# Patient Record
Sex: Female | Born: 1995 | Race: White | Hispanic: Yes | Marital: Single | Smoking: Never smoker
Health system: Southern US, Community
[De-identification: ages and names within clinical notes are randomized; demographics above are authoritative.]

## PROBLEM LIST (undated history)

## (undated) DIAGNOSIS — J45909 Unspecified asthma, uncomplicated: Secondary | ICD-10-CM

## (undated) HISTORY — PX: NO PAST SURGERIES: SHX2092

---
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
## 2018-11-04 ENCOUNTER — Ambulatory Visit
Admission: EM | Admit: 2018-11-04 | Discharge: 2018-11-04 | Disposition: A | Discharge status: Home / Self Care | Payer: Self-pay | Attending: Emergency Medicine | Admitting: Emergency Medicine

## 2018-11-04 ENCOUNTER — Encounter: Payer: Self-pay | Admitting: Emergency Medicine

## 2018-11-04 ENCOUNTER — Other Ambulatory Visit: Payer: Self-pay

## 2018-11-04 DIAGNOSIS — R519 Headache, unspecified: Secondary | ICD-10-CM

## 2018-11-04 DIAGNOSIS — H1131 Conjunctival hemorrhage, right eye: Secondary | ICD-10-CM

## 2018-11-04 DIAGNOSIS — Z3202 Encounter for pregnancy test, result negative: Secondary | ICD-10-CM

## 2018-11-04 DIAGNOSIS — R51 Headache: Secondary | ICD-10-CM

## 2018-11-04 HISTORY — DX: Unspecified asthma, uncomplicated: J45.909

## 2018-11-04 LAB — PREGNANCY, URINE: Preg Test, Ur: NEGATIVE

## 2018-11-04 NOTE — ED Provider Notes (Signed)
HPI  SUBJECTIVE:  Kim Marshall is a 23 y.o. female who reports  gradual onset throbbing diffuse HA starting this morning.  She states that she "felt high" this morning.  This lasted for 2 hours and resolved with 800 mg of ibuprofen which she took at 1130.  Symptoms are relieved with ibuprofen.  No aggravating factors.  No fevers, N/V, photophobia, phonophobia, visual changes, purulent nasal d/c, nasal congestion, sinus pain/pressure, ear pain, jaw pain, dental pain,  dysarthria, focal weakness, facial droop, discoordination. No body aches, neck stiffness, rash. No seizures, syncope. Pt is not sure if she could be pregnant. No sudden onset, did not occur during exertion.  She also reports a right-sided subconjunctival hemorrhage.  She states that she was crying this morning and rubbing her eyes vigorously.  She reports that she lost a favorite uncle earlier this week.  She states that she slept well last night.  Denies taking any medications last night.  Past medical history of migraines,asthma.  negative for aneurysm, SAH/ICH, HIV, sinusitis, glaucoma, stroke, atrial fibrillation or temporal arteritis. Pt is not on any antiplatelets/anticoagulants. FH positive for stroke.  Father had a stroke in his 30s.  LMP: 2 months ago.  Does not use any birth control and is sexually active.  [facility]   Past Medical History:  Diagnosis Date  . Asthma     Past Surgical History:  Procedure Laterality Date  . NO PAST SURGERIES      Family History  Problem Relation Age of Onset  . Hyperlipidemia Mother   . Diabetes Mother   . Diabetes Father     Social History   Tobacco Use  . Smoking status: Never Smoker  . Smokeless tobacco: Never Used  Substance Use Topics  . Alcohol use: Yes    Alcohol/week: 2.0 standard drinks    Types: 2 Standard drinks or equivalent per week    Comment: socially  . Drug use: Yes    Frequency: 3.0 times per week    Types: Marijuana    No current  facility-administered medications for this encounter.  No current outpatient medications on file.  No Known Allergies   ROS  As noted in HPI.   Physical Exam  BP (!) 129/91 (BP Location: Left Arm)   Pulse 65   Temp 98.4 F (36.9 C) (Oral)   Resp 18   Ht 5\' 4"  (1.626 m)   Wt 77.1 kg   LMP 09/09/2018 (Approximate)   SpO2 100%   BMI 29.18 kg/m   Constitutional: Well developed, well nourished, no acute distress Eyes: PERRL, EOMI, positive large subconjunctival hemorrhage right eye.  No photophobia.  No corneal abrasion seen on fluorescein exam. Left eye: 20/40 Right eye: 20/20 Bilateral: 20/20     HENT: Normocephalic, atraumatic,mucus membranes moist, normal nontender dentition.  TM obscured with cerumen bilaterally. no TMJ tenderness.No nasal congestion, no sinus tenderness. No temporal artery tenderness.  Neck: no cervical LN + trapezial muscle tenderness. No meningismus Respiratory: normal inspiratory effort Cardiovascular: Normal rate, regular rhythm no murmurs rubs or gallops GI:  nondistended skin: No rash, skin intact Musculoskeletal: No edema, no tenderness, no deformities Neurologic: Alert & oriented x 3, CN III-XII intact, romberg neg, finger-> nose, heel-> shin equal b/l, Romberg neg, tandem gait steady Psychiatric: Speech and behavior appropriate   ED Course  Medications - No data to display  Orders Placed This Encounter  Procedures  . Pregnancy, urine    Standing Status:   Standing  Number of Occurrences:   1   Results for orders placed or performed during the hospital encounter of 11/04/18 (from the past 24 hour(s))  Pregnancy, urine     Status: None   Collection Time: 11/04/18  2:40 PM  Result Value Ref Range   Preg Test, Ur NEGATIVE NEGATIVE   No results found.   ED Clinical Impression  1. Subconjunctival hemorrhage of right eye   2. Acute nonintractable headache, unspecified headache type     ED Assessment/Plan  Urine pregnancy  negative.  She has a subconjunctival hemorrhage.  Advised patient that this will resolve on its own.  Suspect that this is from rubbing her eyes.  No corneal abrasion.  Headache does not appear to be an emergency.  Most likely tension headache.  Pt describing typical pain, no sudden onset. Doubt SAH, ICH or space occupying lesion. Pt without fevers/chills, Pt has no meningeal sx, no nuchal rigidity. Doubt meningitis. Pt with normal neuro exam, no evidence of CVA/TIA.  Pt BP not elevated significantly, doubt hypertensive emergency. No evidence of temporal artery tenderness, no evidence of glaucoma or other ocular pathology.  She is completely neurologically intact.  Doubt stroke.  Pt meets [facility] criteria (age < 64, no neck pain or stiffness, no onset during exertion, no syncope at onset, no maximum severity within 1 second and no resistance to neck flexion on exam).   Patient declined prescription of ibuprofen, states that she has a prescription already at home.  Advised her to take this 3-4 times a day with 1 g of Tylenol.  She is to go immediately to the ER if her headache gets worse, otherwise follow-up with her primary care physician at [facility] as needed.   Discussed  MDM, plan for follow up, signs and sx that should prompt return to ER. Pt agrees with plan  No orders of the defined types were placed in this encounter.   *This clinic note was created using Dragon dictation software. Therefore, there may be occasional mistakes despite careful proofreading.  ?   Melynda Ripple, MD 11/04/18 [phone]

## 2018-11-04 NOTE — ED Triage Notes (Signed)
Patient in today c/o broken blood vessel in her right eye and headache since this morning.

## 2018-11-04 NOTE — Discharge Instructions (Addendum)
Take 800 mg ibuprofen combined with 1 g of Tylenol 3 times a day.  Make sure you drink at least 2 L of non-caffeinated, non-sugary beverages a day.

## 2018-11-27 ENCOUNTER — Ambulatory Visit
Admission: EM | Admit: 2018-11-27 | Discharge: 2018-11-27 | Disposition: A | Discharge status: Home / Self Care | Payer: Medicaid Other | Attending: Family Medicine | Admitting: Family Medicine

## 2018-11-27 ENCOUNTER — Encounter: Payer: Self-pay | Admitting: Emergency Medicine

## 2018-11-27 ENCOUNTER — Other Ambulatory Visit: Payer: Self-pay

## 2018-11-27 DIAGNOSIS — R079 Chest pain, unspecified: Secondary | ICD-10-CM | POA: Diagnosis present

## 2018-11-27 DIAGNOSIS — M549 Dorsalgia, unspecified: Secondary | ICD-10-CM | POA: Diagnosis not present

## 2018-11-27 DIAGNOSIS — S39012A Strain of muscle, fascia and tendon of lower back, initial encounter: Secondary | ICD-10-CM | POA: Insufficient documentation

## 2018-11-27 DIAGNOSIS — K219 Gastro-esophageal reflux disease without esophagitis: Secondary | ICD-10-CM

## 2018-11-27 DIAGNOSIS — R067 Sneezing: Secondary | ICD-10-CM | POA: Insufficient documentation

## 2018-11-27 DIAGNOSIS — R0989 Other specified symptoms and signs involving the circulatory and respiratory systems: Secondary | ICD-10-CM | POA: Insufficient documentation

## 2018-11-27 DIAGNOSIS — J4520 Mild intermittent asthma, uncomplicated: Secondary | ICD-10-CM

## 2018-11-27 DIAGNOSIS — Z20828 Contact with and (suspected) exposure to other viral communicable diseases: Secondary | ICD-10-CM | POA: Insufficient documentation

## 2018-11-27 MED ORDER — PREDNISONE 20 MG PO TABS: ORAL_TABLET | ORAL | 0 refills | Status: DC

## 2018-11-27 MED ORDER — CYCLOBENZAPRINE HCL 10 MG PO TABS: 10.0000 mg | ORAL_TABLET | Freq: Every day | ORAL | 0 refills | Status: DC

## 2018-11-27 NOTE — ED Triage Notes (Signed)
Pt c/o chest pain in the center of her chest. Worse when she takes a deep breath and the pain is reproduced when she presses on the area. She also c/o  Shortness of breath. She was tested on 11/24/18 for COVID and negative but pt says she was having sneezing, cough and shortness of breath at that time. She did not have the pain in her chest when she was tested. Pt has h/o asthma.

## 2018-11-27 NOTE — Discharge Instructions (Addendum)
Heat to back area Over the counter Pepcid daily Tylenol as needed

## 2018-11-28 LAB — NOVEL CORONAVIRUS, NAA (HOSP ORDER, SEND-OUT TO REF LAB; TAT 18-24 HRS): SARS-CoV-2, NAA: NOT DETECTED

## 2018-11-29 NOTE — ED Provider Notes (Signed)
[facility]    CSN: [id number] Arrival date & time: 11/27/18  1348      History   Chief Complaint Chief Complaint  Patient presents with  . Chest Pain  . Shortness of Breath    HPI Kim Marshall is a 23 y.o. female.   23 yo female with a c/o intermittent shortness of breath and "sharp" chest pains if she takes a deep breath. States she's had cold symptoms with runny nose, sneezing, congestion. Denies any fevers, chills. Complains of back pain and acid reflux as well. Denies any injuries.    Chest Pain Associated symptoms: shortness of breath   Shortness of Breath Associated symptoms: chest pain     Past Medical History:  Diagnosis Date  . Asthma     There are no active problems to display for this patient.   Past Surgical History:  Procedure Laterality Date  . NO PAST SURGERIES      OB History   No obstetric history on file.      Home Medications    Prior to Admission medications   Medication Sig Start Date End Date Taking? Authorizing Provider  cyclobenzaprine (FLEXERIL) 10 MG tablet Take 1 tablet (10 mg total) by mouth at bedtime. 11/27/18   Payton Mccallum, MD  predniSONE (DELTASONE) 20 MG tablet 2 tabs po qd 11/27/18   Payton Mccallum, MD    Family History Family History  Problem Relation Age of Onset  . Hyperlipidemia Mother   . Diabetes Mother   . Diabetes Father     Social History Social History   Tobacco Use  . Smoking status: Never Smoker  . Smokeless tobacco: Never Used  Substance Use Topics  . Alcohol use: Yes    Alcohol/week: 2.0 standard drinks    Types: 2 Standard drinks or equivalent per week    Comment: socially  . Drug use: Yes    Frequency: 3.0 times per week    Types: Marijuana     Allergies   Patient has no known allergies.   Review of Systems Review of Systems  Respiratory: Positive for shortness of breath.   Cardiovascular: Positive for chest pain.     Physical Exam Triage Vital Signs ED Triage  Vitals  Enc Vitals Group     BP 11/27/18 1408 114/70     Pulse Rate 11/27/18 1408 60     Resp 11/27/18 1408 18     Temp 11/27/18 1408 98.4 F (36.9 C)     Temp Source 11/27/18 1408 Oral     SpO2 11/27/18 1408 99 %     Weight 11/27/18 1404 170 lb (77.1 kg)     Height 11/27/18 1404 5\' 4"  (1.626 m)     Head Circumference --      Peak Flow --      Pain Score 11/27/18 1403 5     Pain Loc --      Pain Edu? --      Excl. in GC? --    No data found.  Updated Vital Signs BP 114/70 (BP Location: Left Arm)   Pulse 60   Temp 98.4 F (36.9 C) (Oral)   Resp 18   Ht 5\' 4"  (1.626 m)   Wt 77.1 kg   SpO2 99%   BMI 29.18 kg/m   Visual Acuity Right Eye Distance:   Left Eye Distance:   Bilateral Distance:    Right Eye Near:   Left Eye Near:    Bilateral Near:  Physical Exam Vitals signs and nursing note reviewed.  Constitutional:      General: She is not in acute distress.    Appearance: She is not toxic-appearing or diaphoretic.  Cardiovascular:     Rate and Rhythm: Normal rate.     Heart sounds: Normal heart sounds.  Pulmonary:     Effort: Pulmonary effort is normal. No respiratory distress.     Breath sounds: Normal breath sounds. No stridor. No wheezing or rhonchi.  Abdominal:     General: Bowel sounds are normal. There is no distension.     Palpations: Abdomen is soft.     Tenderness: There is no abdominal tenderness.  Neurological:     Mental Status: She is alert.      UC Treatments / Results  Labs (all labs ordered are listed, but only abnormal results are displayed) Labs Reviewed  NOVEL CORONAVIRUS, NAA (HOSP ORDER, SEND-OUT TO REF LAB; TAT 18-24 HRS)    EKG   Radiology No results found.  Procedures Procedures (including critical care time)  Medications Ordered in UC Medications - No data to display  Initial Impression / Assessment and Plan / UC Course  I have reviewed the triage vital signs and the nursing notes.  Pertinent labs & imaging  results that were available during my care of the patient were reviewed by me and considered in my medical decision making (see chart for details).      Final Clinical Impressions(s) / UC Diagnoses   Final diagnoses:  Mild intermittent asthma without complication  Gastroesophageal reflux disease, esophagitis presence not specified  Back strain, initial encounter     Discharge Instructions     Heat to back area Over the counter Pepcid daily Tylenol as needed    ED Prescriptions    Medication Sig Dispense Auth. Provider   predniSONE (DELTASONE) 20 MG tablet 2 tabs po qd 10 tablet Norval Gable, MD   cyclobenzaprine (FLEXERIL) 10 MG tablet Take 1 tablet (10 mg total) by mouth at bedtime. 30 tablet Norval Gable, MD      1. Labs/x-ray results and diagnosis reviewed with patient 2. rx as per orders above; reviewed possible side effects, interactions, risks and benefits  3. Recommend supportive treatment as above 4. covid test done 5. Follow-up prn if symptoms worsen or don't improve  PDMP not reviewed this encounter.   Norval Gable, MD 11/29/18 [phone]

## 2019-04-01 ENCOUNTER — Ambulatory Visit: Payer: Medicaid Other | Admitting: Family Medicine

## 2019-05-10 ENCOUNTER — Ambulatory Visit: Payer: Medicaid Other | Attending: Internal Medicine

## 2019-05-10 DIAGNOSIS — Z20822 Contact with and (suspected) exposure to covid-19: Secondary | ICD-10-CM

## 2019-05-11 LAB — NOVEL CORONAVIRUS, NAA: SARS-CoV-2, NAA: NOT DETECTED

## 2019-05-17 ENCOUNTER — Ambulatory Visit: Payer: Medicaid Other | Attending: Internal Medicine

## 2019-05-17 DIAGNOSIS — Z20822 Contact with and (suspected) exposure to covid-19: Secondary | ICD-10-CM

## 2019-05-18 LAB — NOVEL CORONAVIRUS, NAA: SARS-CoV-2, NAA: NOT DETECTED

## 2019-05-26 ENCOUNTER — Ambulatory Visit: Payer: Medicaid Other | Attending: Internal Medicine

## 2019-05-26 DIAGNOSIS — Z20822 Contact with and (suspected) exposure to covid-19: Secondary | ICD-10-CM

## 2019-05-27 LAB — SARS-COV-2, NAA 2 DAY TAT

## 2019-05-27 LAB — NOVEL CORONAVIRUS, NAA: SARS-CoV-2, NAA: NOT DETECTED

## 2019-05-31 ENCOUNTER — Ambulatory Visit: Payer: Medicaid Other | Attending: Internal Medicine

## 2019-05-31 DIAGNOSIS — Z20822 Contact with and (suspected) exposure to covid-19: Secondary | ICD-10-CM

## 2019-06-01 LAB — NOVEL CORONAVIRUS, NAA: SARS-CoV-2, NAA: NOT DETECTED

## 2019-06-01 LAB — SARS-COV-2, NAA 2 DAY TAT

## 2019-06-07 ENCOUNTER — Ambulatory Visit: Payer: Medicaid Other | Attending: Internal Medicine

## 2019-06-07 DIAGNOSIS — Z20822 Contact with and (suspected) exposure to covid-19: Secondary | ICD-10-CM

## 2019-06-09 LAB — NOVEL CORONAVIRUS, NAA: SARS-CoV-2, NAA: NOT DETECTED

## 2019-06-09 LAB — SARS-COV-2, NAA 2 DAY TAT

## 2019-06-14 ENCOUNTER — Ambulatory Visit: Payer: Medicaid Other | Attending: Internal Medicine

## 2019-06-14 DIAGNOSIS — Z20822 Contact with and (suspected) exposure to covid-19: Secondary | ICD-10-CM

## 2019-06-16 LAB — SARS-COV-2, NAA 2 DAY TAT

## 2019-06-16 LAB — NOVEL CORONAVIRUS, NAA: SARS-CoV-2, NAA: NOT DETECTED

## 2019-06-21 ENCOUNTER — Ambulatory Visit: Payer: Medicaid Other | Attending: Internal Medicine

## 2019-06-21 DIAGNOSIS — Z20822 Contact with and (suspected) exposure to covid-19: Secondary | ICD-10-CM

## 2019-06-22 LAB — SARS-COV-2, NAA 2 DAY TAT

## 2019-06-22 LAB — NOVEL CORONAVIRUS, NAA: SARS-CoV-2, NAA: NOT DETECTED

## 2019-06-28 ENCOUNTER — Ambulatory Visit: Payer: Medicaid Other | Attending: Internal Medicine

## 2019-06-28 DIAGNOSIS — Z20822 Contact with and (suspected) exposure to covid-19: Secondary | ICD-10-CM

## 2019-06-29 LAB — SARS-COV-2, NAA 2 DAY TAT

## 2019-06-29 LAB — NOVEL CORONAVIRUS, NAA: SARS-CoV-2, NAA: NOT DETECTED

## 2019-07-05 ENCOUNTER — Ambulatory Visit: Payer: Medicaid Other | Attending: Internal Medicine

## 2019-07-05 DIAGNOSIS — Z20822 Contact with and (suspected) exposure to covid-19: Secondary | ICD-10-CM

## 2019-07-06 LAB — SARS-COV-2, NAA 2 DAY TAT

## 2019-07-06 LAB — NOVEL CORONAVIRUS, NAA: SARS-CoV-2, NAA: NOT DETECTED

## 2020-03-27 ENCOUNTER — Other Ambulatory Visit: Payer: Medicaid Other

## 2020-10-22 ENCOUNTER — Other Ambulatory Visit: Payer: Self-pay

## 2020-10-22 ENCOUNTER — Emergency Department: Payer: Self-pay

## 2020-10-22 ENCOUNTER — Emergency Department
Admission: EM | Admit: 2020-10-22 | Discharge: 2020-10-22 | Disposition: A | Discharge status: Home / Self Care | Payer: Self-pay | Attending: Emergency Medicine | Admitting: Emergency Medicine

## 2020-10-22 ENCOUNTER — Encounter: Payer: Self-pay | Admitting: Emergency Medicine

## 2020-10-22 DIAGNOSIS — S161XXA Strain of muscle, fascia and tendon at neck level, initial encounter: Secondary | ICD-10-CM | POA: Diagnosis not present

## 2020-10-22 DIAGNOSIS — Y92410 Unspecified street and highway as the place of occurrence of the external cause: Secondary | ICD-10-CM | POA: Diagnosis not present

## 2020-10-22 DIAGNOSIS — J45909 Unspecified asthma, uncomplicated: Secondary | ICD-10-CM | POA: Diagnosis not present

## 2020-10-22 DIAGNOSIS — S199XXA Unspecified injury of neck, initial encounter: Secondary | ICD-10-CM | POA: Diagnosis present

## 2020-10-22 MED ORDER — IBUPROFEN 600 MG PO TABS
600.0000 mg | ORAL_TABLET | Freq: Once | ORAL | Status: AC
  Administered 2020-10-22: 600 mg via ORAL
  Filled 2020-10-22: qty 1

## 2020-10-22 MED ORDER — NAPROXEN 500 MG PO TABS: 500.0000 mg | ORAL_TABLET | Freq: Two times a day (BID) | ORAL | 0 refills | Status: AC

## 2020-10-22 MED ORDER — CYCLOBENZAPRINE HCL 10 MG PO TABS
5.0000 mg | ORAL_TABLET | Freq: Once | ORAL | Status: AC
  Administered 2020-10-22: 5 mg via ORAL
  Filled 2020-10-22: qty 1

## 2020-10-22 MED ORDER — CYCLOBENZAPRINE HCL 5 MG PO TABS
5.0000 mg | ORAL_TABLET | Freq: Three times a day (TID) | ORAL | 0 refills | Status: AC | PRN
Start: 2020-10-22 — End: ?

## 2020-10-22 NOTE — Discharge Instructions (Signed)
Take the prescription meds as directed. Follow-up with [facility] or return if needed.
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
## 2021-01-31 ENCOUNTER — Other Ambulatory Visit: Payer: Self-pay

## 2021-01-31 ENCOUNTER — Emergency Department
Admission: EM | Admit: 2021-01-31 | Discharge: 2021-01-31 | Disposition: A | Discharge status: Home / Self Care | Payer: Medicaid Other | Attending: Emergency Medicine | Admitting: Emergency Medicine

## 2021-01-31 ENCOUNTER — Encounter: Payer: Self-pay | Admitting: Emergency Medicine

## 2021-01-31 ENCOUNTER — Emergency Department: Payer: Medicaid Other

## 2021-01-31 DIAGNOSIS — J45909 Unspecified asthma, uncomplicated: Secondary | ICD-10-CM | POA: Insufficient documentation

## 2021-01-31 DIAGNOSIS — J101 Influenza due to other identified influenza virus with other respiratory manifestations: Secondary | ICD-10-CM | POA: Insufficient documentation

## 2021-01-31 DIAGNOSIS — J111 Influenza due to unidentified influenza virus with other respiratory manifestations: Secondary | ICD-10-CM

## 2021-01-31 DIAGNOSIS — Z20822 Contact with and (suspected) exposure to covid-19: Secondary | ICD-10-CM | POA: Insufficient documentation

## 2021-01-31 DIAGNOSIS — R300 Dysuria: Secondary | ICD-10-CM | POA: Insufficient documentation

## 2021-01-31 LAB — BASIC METABOLIC PANEL
Anion gap: 7 (ref 5–15)
BUN: 10 mg/dL (ref 6–20)
CO2: 24 mmol/L (ref 22–32)
Calcium: 9 mg/dL (ref 8.9–10.3)
Chloride: 102 mmol/L (ref 98–111)
Creatinine, Ser: 0.67 mg/dL (ref 0.44–1.00)
GFR, Estimated: >60 mL/min (ref >60)
Glucose, Bld: 113 mg/dL — ABNORMAL HIGH (ref 70–99)
Potassium: 3.3 mmol/L — ABNORMAL LOW (ref 3.5–5.1)
Sodium: 133 mmol/L — ABNORMAL LOW (ref 135–145)

## 2021-01-31 LAB — CBC
HCT: 43.4 % (ref 36.0–46.0)
Hemoglobin: 15.1 g/dL — ABNORMAL HIGH (ref 12.0–15.0)
MCH: 29.2 pg (ref 26.0–34.0)
MCHC: 34.8 g/dL (ref 30.0–36.0)
MCV: 83.9 fL (ref 80.0–100.0)
Platelets: 244 10*3/uL (ref 150–400)
RBC: 5.17 MIL/uL — ABNORMAL HIGH (ref 3.87–5.11)
RDW: 12.9 % (ref 11.5–15.5)
WBC: 13 10*3/uL — ABNORMAL HIGH (ref 4.0–10.5)
nRBC: 0 % (ref 0.0–0.2)

## 2021-01-31 LAB — URINALYSIS, COMPLETE (UACMP) WITH MICROSCOPIC
Bilirubin Urine: NEGATIVE
Glucose, UA: NEGATIVE mg/dL
Leukocytes,Ua: NEGATIVE
Nitrite: NEGATIVE
Protein, ur: 100 mg/dL — AB
Specific Gravity, Urine: >1.03 — ABNORMAL HIGH (ref 1.005–1.030)
pH: 6 (ref 5.0–8.0)

## 2021-01-31 LAB — TROPONIN I (HIGH SENSITIVITY): Troponin I (High Sensitivity): 5 ng/L (ref <18)

## 2021-01-31 LAB — RESP PANEL BY RT-PCR (FLU A&B, COVID) ARPGX2
Influenza A by PCR: POSITIVE — AB
Influenza B by PCR: NEGATIVE
SARS Coronavirus 2 by RT PCR: NEGATIVE

## 2021-01-31 LAB — GROUP A STREP BY PCR: Group A Strep by PCR: NOT DETECTED

## 2021-01-31 LAB — POC URINE PREG, ED: Preg Test, Ur: NEGATIVE

## 2021-01-31 MED ORDER — ONDANSETRON HCL 4 MG PO TABS: 4.0000 mg | ORAL_TABLET | Freq: Every day | ORAL | 1 refills | Status: AC | PRN

## 2021-01-31 MED ORDER — IBUPROFEN 400 MG PO TABS
400.0000 mg | ORAL_TABLET | Freq: Once | ORAL | Status: AC
  Administered 2021-01-31: 400 mg via ORAL
  Filled 2021-01-31: qty 1

## 2021-01-31 MED ORDER — ONDANSETRON HCL 4 MG PO TABS
4.0000 mg | ORAL_TABLET | Freq: Once | ORAL | Status: AC
  Administered 2021-01-31: 4 mg via ORAL
  Filled 2021-01-31: qty 1

## 2021-01-31 MED ORDER — ACETAMINOPHEN 325 MG PO TABS
650.0000 mg | ORAL_TABLET | Freq: Once | ORAL | Status: AC
  Administered 2021-01-31: 650 mg via ORAL
  Filled 2021-01-31: qty 2

## 2021-01-31 MED ORDER — ALBUTEROL SULFATE HFA 108 (90 BASE) MCG/ACT IN AERS: 2.0000 | INHALATION_SPRAY | Freq: Four times a day (QID) | RESPIRATORY_TRACT | 2 refills | Status: AC | PRN

## 2021-01-31 MED ORDER — SODIUM CHLORIDE 0.9 % IV BOLUS
1000.0000 mL | Freq: Once | INTRAVENOUS | Status: AC
  Administered 2021-01-31: 1000 mL via INTRAVENOUS

## 2021-01-31 NOTE — ED Provider Notes (Signed)
[facility]  ____________________________________________   Event Date/Time   First MD Initiated Contact with Patient 01/31/21 2047     (approximate)  I have reviewed the triage vital signs and the nursing notes.   HISTORY  Chief Complaint Chest Pain, Shortness of Breath, and Nausea    HPI Kim Marshall is a 25 y.o. female with past medical history of asthma who presents with fever cough myalgias.  Symptoms started about 2 days ago.  She endorses cough that is productive of clear sputum as well as some central chest pain that is worse with coughing.  She has had several episodes of emesis and diarrhea but denies abdominal pain.  Has had subjective fevers as well.  Her partner was sick last week for this is resolved.  She also endorses myalgias and some dysuria.  No back pain.         Past Medical History:  Diagnosis Date   Asthma     There are no problems to display for this patient.   Past Surgical History:  Procedure Laterality Date   NO PAST SURGERIES      Prior to Admission medications   Medication Sig Start Date End Date Taking? Authorizing Provider  albuterol (VENTOLIN HFA) 108 (90 Base) MCG/ACT inhaler Inhale 2 puffs into the lungs every 6 (six) hours as needed for wheezing or shortness of breath. 01/31/21  Yes Georga Hacking, MD  ondansetron (ZOFRAN) 4 MG tablet Take 1 tablet (4 mg total) by mouth daily as needed for nausea or vomiting. 01/31/21 01/31/22 Yes Georga Hacking, MD  cyclobenzaprine (FLEXERIL) 5 MG tablet Take 1 tablet (5 mg total) by mouth 3 (three) times daily as needed. 10/22/20   Menshew, Charlesetta Ivory, PA-C    Allergies Patient has no known allergies.  Family History  Problem Relation Age of Onset   Hyperlipidemia Mother    Diabetes Mother    Diabetes Father     Social History Social History   Tobacco Use   Smoking status: Never   Smokeless tobacco: Never  Vaping Use   Vaping Use: Never used   Substance Use Topics   Alcohol use: Yes    Alcohol/week: 2.0 standard drinks    Types: 2 Standard drinks or equivalent per week    Comment: socially   Drug use: Yes    Frequency: 3.0 times per week    Types: Marijuana    Review of Systems   Review of Systems  Constitutional:  Positive for chills and fever.  HENT:  Positive for congestion.   Respiratory:  Positive for cough, chest tightness and shortness of breath.   Gastrointestinal:  Positive for diarrhea, nausea and vomiting. Negative for abdominal pain.  Genitourinary:  Positive for dysuria.  Musculoskeletal:  Positive for myalgias.  All other systems reviewed and are negative.  Physical Exam Updated Vital Signs BP (!) 144/87   Pulse (!) 101   Temp (!) 100.8 F (38.2 C) (Oral)   Resp 20   Ht 5\' 3"  (1.6 m)   Wt 83.9 kg   SpO2 93%   BMI 32.77 kg/m   Physical Exam Vitals and nursing note reviewed.  Constitutional:      General: She is not in acute distress.    Appearance: Normal appearance.  HENT:     Head: Normocephalic and atraumatic.  Eyes:     General: No scleral icterus.    Conjunctiva/sclera: Conjunctivae normal.  Cardiovascular:     Rate and Rhythm: Regular  rhythm. Tachycardia present.     Heart sounds: Normal heart sounds.  Pulmonary:     Effort: Pulmonary effort is normal. No respiratory distress.     Breath sounds: No stridor. No wheezing.  Abdominal:     Palpations: Abdomen is soft.     Tenderness: There is no abdominal tenderness. There is no guarding.  Musculoskeletal:        General: No deformity or signs of injury.     Cervical back: Normal range of motion.  Skin:    General: Skin is dry.     Coloration: Skin is not jaundiced or pale.  Neurological:     General: No focal deficit present.     Mental Status: She is alert and oriented to person, place, and time. Mental status is at baseline.  Psychiatric:        Mood and Affect: Mood normal.        Behavior: Behavior normal.      LABS (all labs ordered are listed, but only abnormal results are displayed)  Labs Reviewed  RESP PANEL BY RT-PCR (FLU A&B, COVID) ARPGX2 - Abnormal; Notable for the following components:      Result Value   Influenza A by PCR POSITIVE (*)    All other components within normal limits  BASIC METABOLIC PANEL - Abnormal; Notable for the following components:   Sodium 133 (*)    Potassium 3.3 (*)    Glucose, Bld 113 (*)    All other components within normal limits  CBC - Abnormal; Notable for the following components:   WBC 13.0 (*)    RBC 5.17 (*)    Hemoglobin 15.1 (*)    All other components within normal limits  URINALYSIS, COMPLETE (UACMP) WITH MICROSCOPIC - Abnormal; Notable for the following components:   Specific Gravity, Urine >1.030 (*)    Hgb urine dipstick TRACE (*)    Ketones, ur TRACE (*)    Protein, ur 100 (*)    Bacteria, UA RARE (*)    All other components within normal limits  GROUP A STREP BY PCR  POC URINE PREG, ED  TROPONIN I (HIGH SENSITIVITY)   ____________________________________________  EKG  Sinus tachycardia, normal axis, normal intervals, nonspecific T wave flattening throughout ____________________________________________  RADIOLOGY I, Randol Kern, personally viewed and evaluated these images (plain radiographs) as part of my medical decision making, as well as reviewing the written report by the radiologist.  ED MD interpretation:  I reviewed the CXR which does not show any acute cardiopulmonary process      ____________________________________________   PROCEDURES  Procedure(s) performed (including Critical Care):  Procedures   ____________________________________________   INITIAL IMPRESSION / ASSESSMENT AND PLAN / ED COURSE  Patient is a female presenting with cough fevers myalgias and shortness of breath x2 days.  She is tachycardic and febrile.  Overall she looks well on exam she is not wheezing abdomen is benign.   She is still somewhat tachycardic labs are notable for leukocytosis of 13, BMP reassuring.  A troponin was obtained given her chest pain which is negative, her EKG has some nonspecific T wave abnormalities but her pain is nonischemic, worse with coughing.  She is notably flu a positive.  She was given Tylenol and Motrin and continued to be tachycardic despite her fever improving somewhat.  We will place an IV and give her a liter of fluids and reassess.  She was having some dysuria so UA obtained which is not consistent with infection.  Her tachycardia is in the setting of ongoing fever and dehydration.  Low suspicion for myocarditis at this time given the negative troponins.  Patient's heart rate improved after IV fluids.  Will discharge with Zofran and albuterol inhaler at the patient's request.  Clinical Course as of 01/31/21 2324  Wed Jan 31, 2021  2048 Influenza A By PCR(!): POSITIVE [KM]  2156 Preg Test, Ur: Negative [KM]    Clinical Course User Index [KM] Georga Hacking, MD     ____________________________________________   FINAL CLINICAL IMPRESSION(S) / ED DIAGNOSES  Final diagnoses:  Influenza     ED Discharge Orders          Ordered    albuterol (VENTOLIN HFA) 108 (90 Base) MCG/ACT inhaler  Every 6 hours PRN        01/31/21 2314    ondansetron (ZOFRAN) 4 MG tablet  Daily PRN        01/31/21 2314             Note:  This document was prepared using Dragon voice recognition software and may include unintentional dictation errors.    Georga Hacking, MD 01/31/21 [phone]

## 2021-01-31 NOTE — Discharge Instructions (Addendum)
You have influenza A. You can take Zofran as needed for if you are unable to eat or drink, shortness of breath is worsening, please return to emergency 24 year old

## 2021-01-31 NOTE — ED Triage Notes (Signed)
Pt reports that she is having chest pain, SHOB and nausea for the last two days. She has hx of asthma and the [facility] is different, she recently had COVID at the end of August

## 2022-06-20 ENCOUNTER — Encounter: Payer: Self-pay | Admitting: Emergency Medicine

## 2022-06-20 ENCOUNTER — Ambulatory Visit
Admission: EM | Admit: 2022-06-20 | Discharge: 2022-06-20 | Disposition: A | Discharge status: Home / Self Care | Payer: Medicaid Other | Attending: Internal Medicine | Admitting: Internal Medicine

## 2022-06-20 ENCOUNTER — Other Ambulatory Visit: Payer: Self-pay

## 2022-06-20 DIAGNOSIS — R6889 Other general symptoms and signs: Secondary | ICD-10-CM | POA: Diagnosis not present

## 2022-06-20 LAB — POCT INFLUENZA A/B
Influenza A, POC: NEGATIVE
Influenza B, POC: NEGATIVE
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
## 2022-06-20 MED ORDER — ONDANSETRON 4 MG PO TBDP
4.0000 mg | ORAL_TABLET | Freq: Once | ORAL | Status: AC
  Administered 2022-06-20: 4 mg via ORAL

## 2022-06-20 MED ORDER — ONDANSETRON 4 MG PO TBDP: 4.0000 mg | ORAL_TABLET | Freq: Three times a day (TID) | ORAL | 0 refills | Status: AC | PRN

## 2022-06-20 NOTE — ED Triage Notes (Addendum)
Reports Tuesday morning started vomiting.  Patient complains of headache and fever-reports fever was 101 yesterday.  Denies vomiting today, no known fever today.  Patient reports a cough.   Patient had tylenol at 10 am.    Patient has taken tylenol and used her albuterol

## 2022-06-20 NOTE — Discharge Instructions (Addendum)
Push fluids. Get rest. Follow up here or with your primary care provider if your symptoms are worsening or not improving.

## 2022-06-20 NOTE — ED Provider Notes (Signed)
Renaldo Fiddler    CSN: [id number] Arrival date & time: 06/20/22  1028      History   Chief Complaint Chief Complaint  Patient presents with   Emesis    HPI Betsy Rosello is a 27 y.o. female.    Emesis   Patient presents to urgent care with symptoms starting 2 days ago.  She reports vomiting, headache.  Fever 101 F yesterday.  No vomiting today no known fever today.  Endorses cough, nasal congestion.  Patient is concerned with possibility of dehydration.  She reports multiple episodes of vomiting and not tolerant of food.  She does report tolerating fluids today, sugar-free Gatorade.  Endorses urine production though she states her urine is dark.  Past Medical History:  Diagnosis Date   Asthma     There are no problems to display for this patient.   Past Surgical History:  Procedure Laterality Date   NO PAST SURGERIES      OB History   No obstetric history on file.      Home Medications    Prior to Admission medications   Medication Sig Start Date End Date Taking? Authorizing Provider  albuterol (VENTOLIN HFA) 108 (90 Base) MCG/ACT inhaler Inhale 2 puffs into the lungs every 6 (six) hours as needed for wheezing or shortness of breath. 01/31/21   Georga Hacking, MD  cyclobenzaprine (FLEXERIL) 5 MG tablet Take 1 tablet (5 mg total) by mouth 3 (three) times daily as needed. Patient not taking: Reported on 06/20/2022 10/22/20   Menshew, Charlesetta Ivory, PA-C    Family History Family History  Problem Relation Age of Onset   Hyperlipidemia Mother    Diabetes Mother    Diabetes Father     Social History Social History   Tobacco Use   Smoking status: Never   Smokeless tobacco: Never  Vaping Use   Vaping Use: Never used  Substance Use Topics   Alcohol use: Yes    Alcohol/week: 2.0 standard drinks of alcohol    Types: 2 Standard drinks or equivalent per week    Comment: socially   Drug use: Yes    Frequency: 3.0 times per week    Types:  Marijuana     Allergies   Patient has no known allergies.   Review of Systems Review of Systems  Gastrointestinal:  Positive for vomiting.     Physical Exam Triage Vital Signs ED Triage Vitals  Enc Vitals Group     BP 06/20/22 1111 118/76     Pulse Rate 06/20/22 1111 99     Resp 06/20/22 1111 18     Temp 06/20/22 1111 100 F (37.8 C)     Temp Source 06/20/22 1111 Oral     SpO2 06/20/22 1111 96 %     Weight --      Height --      Head Circumference --      Peak Flow --      Pain Score 06/20/22 1107 8     Pain Loc --      Pain Edu? --      Excl. in GC? --    No data found.  Updated Vital Signs BP 118/76 (BP Location: Left Arm)   Pulse 99   Temp 100 F (37.8 C) (Oral) Comment: took tylenol oat 10:00am  Resp 18   SpO2 96%   Visual Acuity Right Eye Distance:   Left Eye Distance:   Bilateral Distance:    Right Eye  Near:   Left Eye Near:    Bilateral Near:     Physical Exam Vitals reviewed.  Constitutional:      Appearance: Normal appearance.  HENT:     Mouth/Throat:     Mouth: Mucous membranes are moist.     Pharynx: No oropharyngeal exudate or posterior oropharyngeal erythema.  Cardiovascular:     Rate and Rhythm: Normal rate and regular rhythm.     Pulses: Normal pulses.     Heart sounds: Normal heart sounds.  Pulmonary:     Effort: Pulmonary effort is normal.     Breath sounds: Normal breath sounds.  Abdominal:     Tenderness: There is no abdominal tenderness.  Skin:    General: Skin is warm and dry.  Neurological:     General: No focal deficit present.     Mental Status: She is alert and oriented to person, place, and time.  Psychiatric:        Mood and Affect: Mood normal.        Behavior: Behavior normal.      UC Treatments / Results  Labs (all labs ordered are listed, but only abnormal results are displayed) Labs Reviewed - No data to display  EKG   Radiology No results found.  Procedures Procedures (including critical  care time)  Medications Ordered in UC Medications  ondansetron (ZOFRAN-ODT) disintegrating tablet 4 mg (4 mg Oral Given 06/20/22 1117)    Initial Impression / Assessment and Plan / UC Course  I have reviewed the triage vital signs and the nursing notes.  Pertinent labs & imaging results that were available during my care of the patient were reviewed by me and considered in my medical decision making (see chart for details).   Zaya Kessenich is a 27 y.o. female presenting with flu like symptoms including fever and vomiting. Patient is afebrile without recent antipyretics, satting well on room air. Overall is ill appearing though non-toxic, well hydrated, without respiratory distress. Pulmonary exam is unremarkable.  Lungs CTAB without wheezing, rhonchi, rales.  No pharyngeal erythema or peritonsillar exudates.  Zofran administered in clinic.  Patient's symptoms are consistent with an acute viral process.  Given symptoms are less than 2 days, suspicious for flu.  POC influenza result is negative.  Recommend supportive care and use of OTC medication for symptom control including antipyretics.  She does not appear dry however encouraged patient to push fluids, water and electrolyte replacements. Tolerating water in clinic. Advance diet as tolerated  Counseled patient on potential for adverse effects with medications prescribed/recommended today, ER and return-to-clinic precautions discussed, patient verbalized understanding and agreement with care plan.   Final Clinical Impressions(s) / UC Diagnoses   Final diagnoses:  None   Discharge Instructions   None    ED Prescriptions   None    PDMP not reviewed this encounter.   Charma Igo, [place] 06/20/22 1156

## 2022-09-29 IMAGING — CR DG CHEST 2V
2 series · 2 of 2 positions shown · non-contrast
Comparison: Chest x-ray dated July 01, 2017

CLINICAL DATA: Chest pain

EXAM:
CHEST - 2 VIEW

[chest pa]
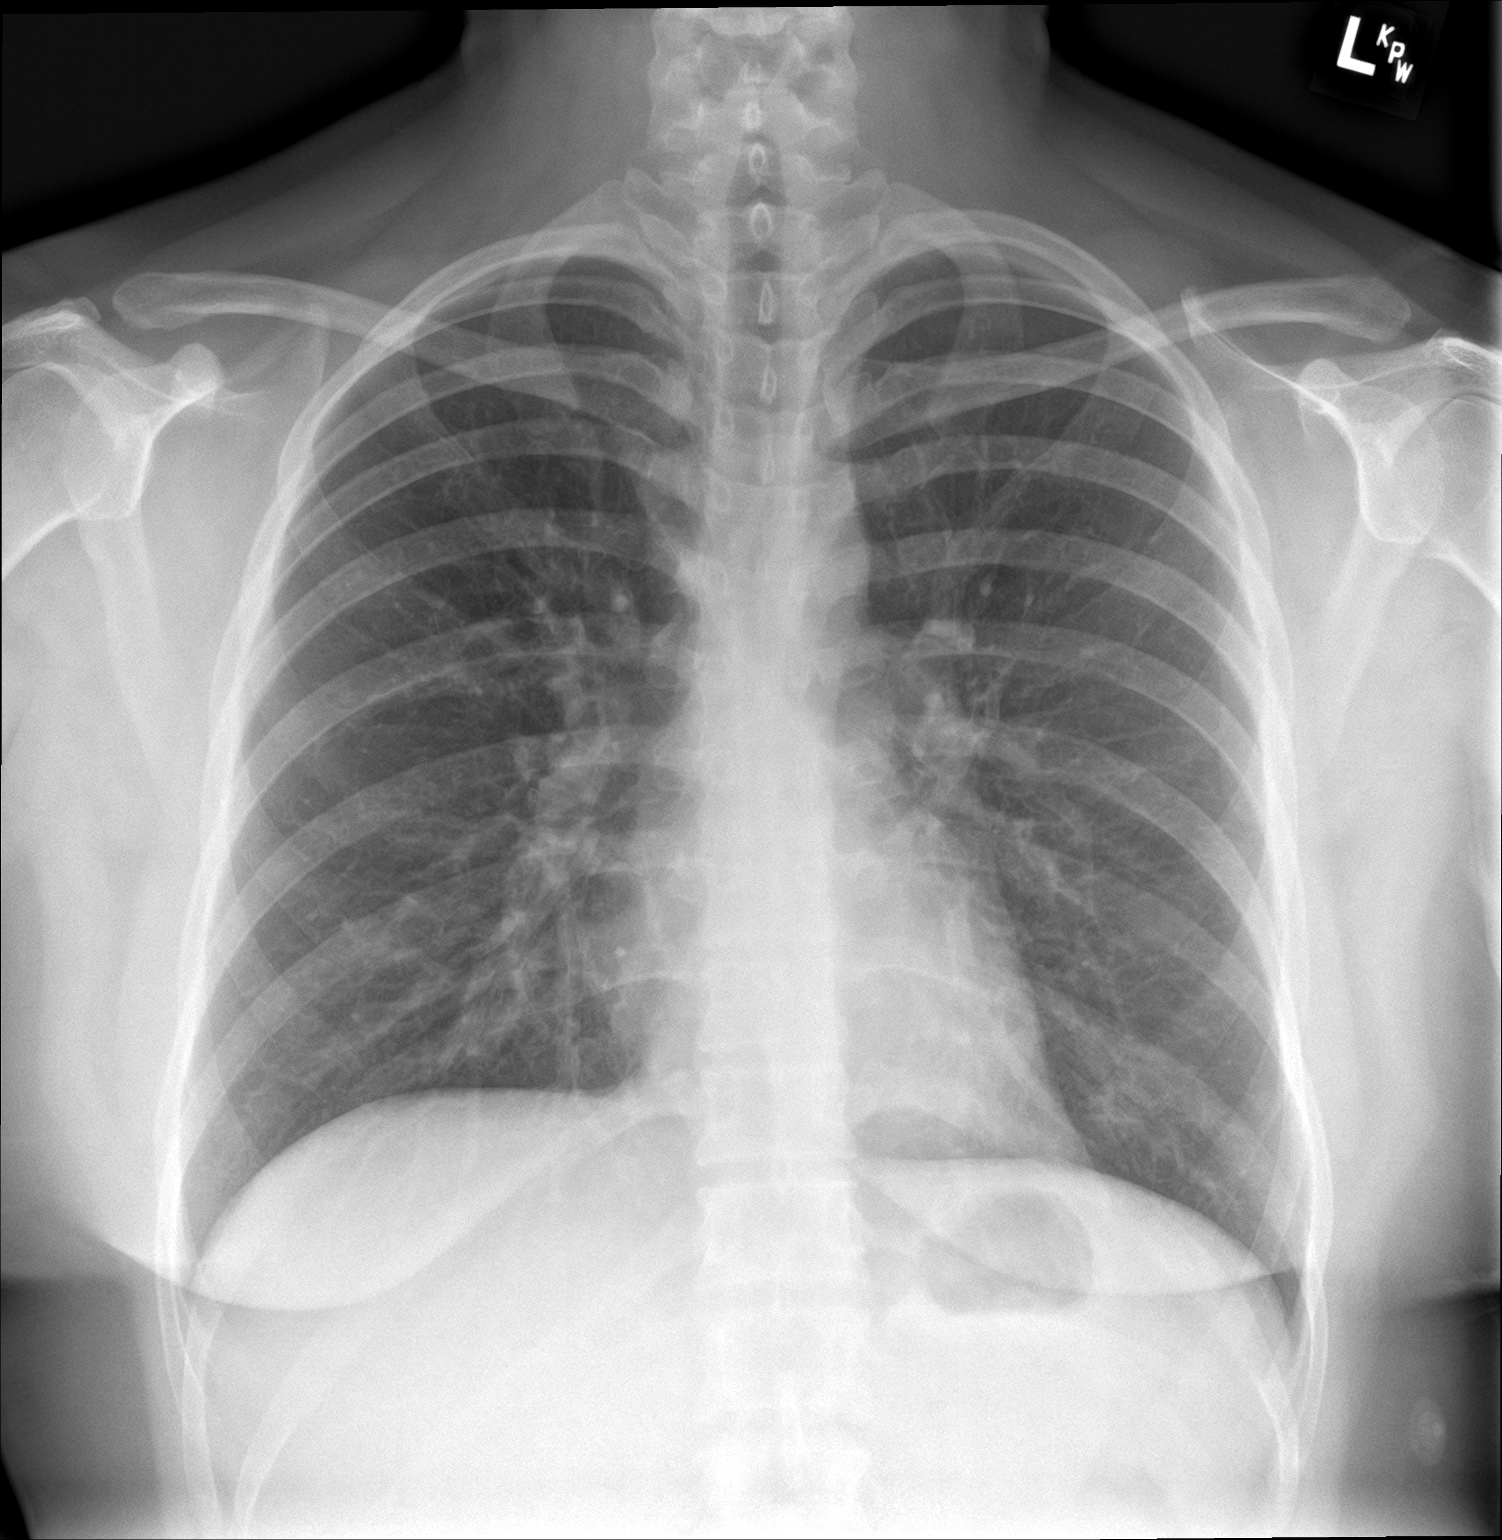

[chest lat]
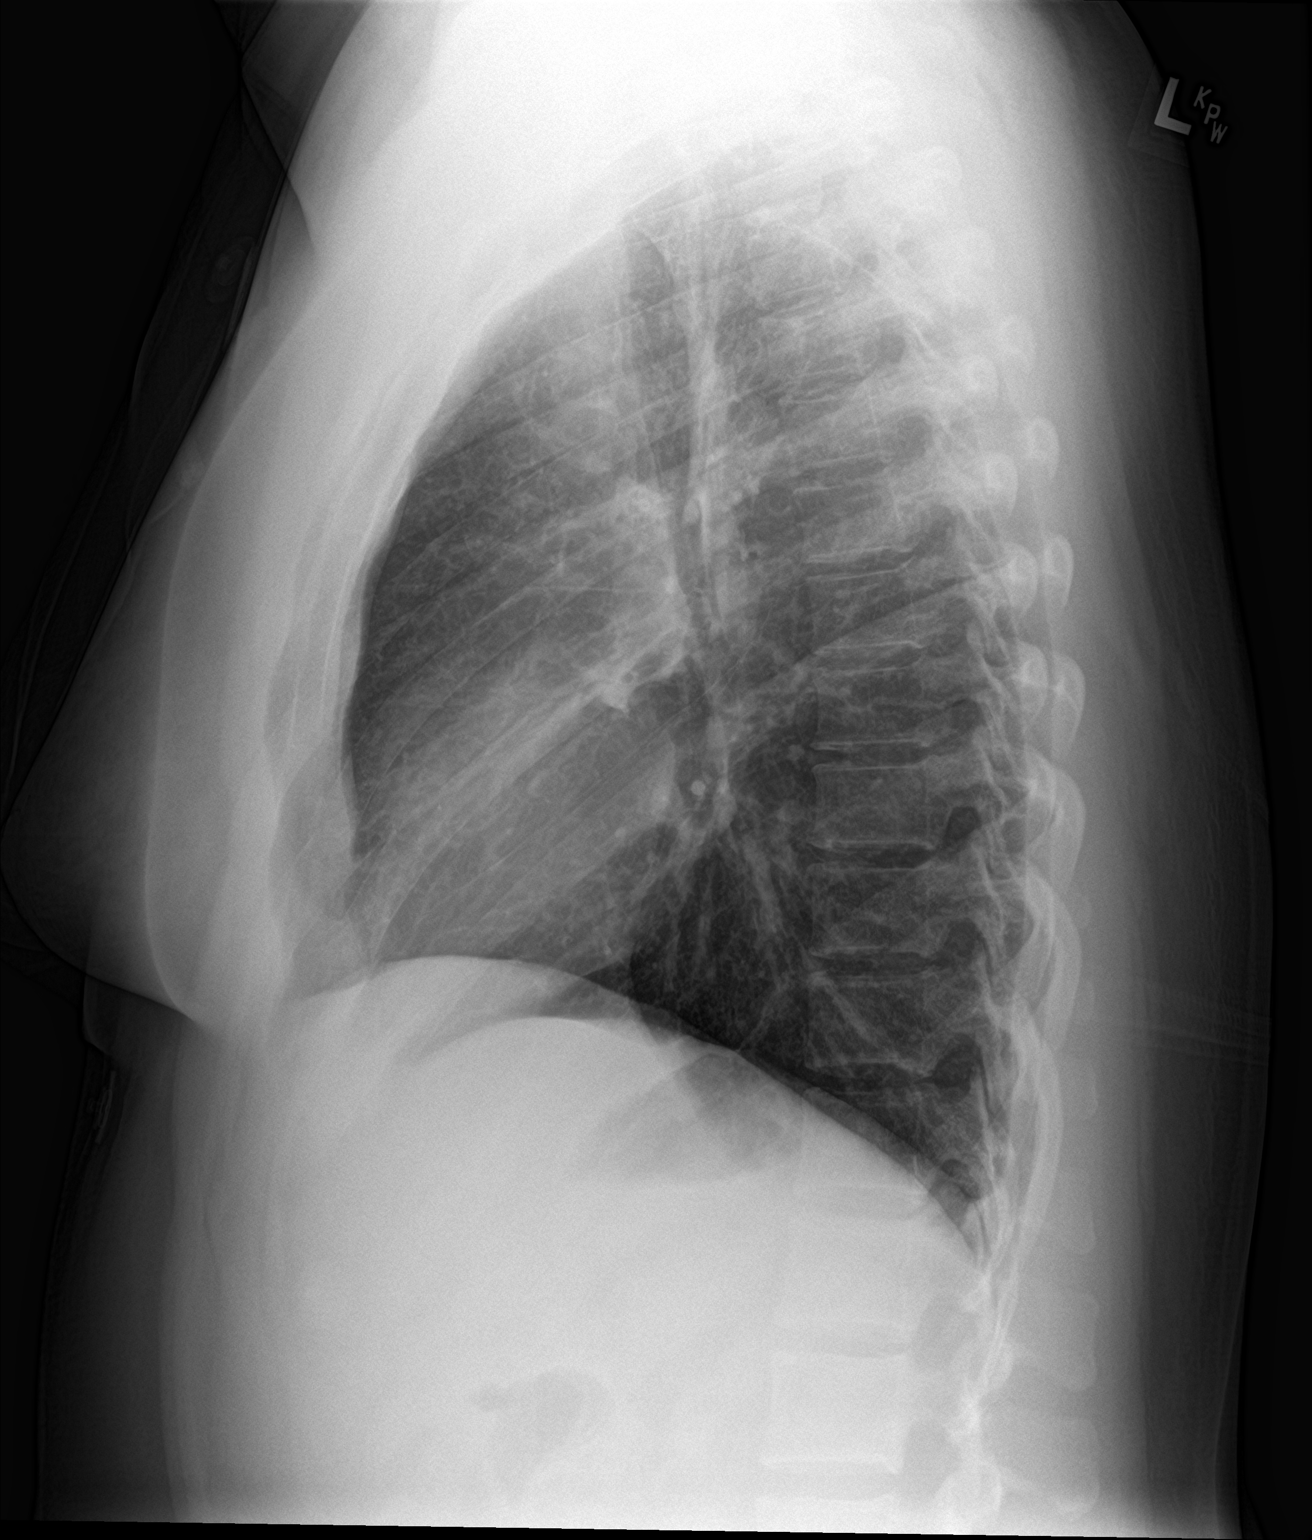

[2 of 2 positions shown; findings below may reference images not displayed]

FINDINGS: The heart size and mediastinal contours are within normal limits.
Both lungs are clear. The visualized skeletal structures are
unremarkable.
IMPRESSION: No active cardiopulmonary disease.
